# Patient Record
Sex: Male | Born: 1970 | Race: Black or African American | Hispanic: No | Marital: Single | Smoking: Former smoker
Health system: Southern US, Community
[De-identification: ages and names within clinical notes are randomized; demographics above are authoritative.]

## PROBLEM LIST (undated history)

## (undated) DIAGNOSIS — E119 Type 2 diabetes mellitus without complications: Secondary | ICD-10-CM

## (undated) DIAGNOSIS — I10 Essential (primary) hypertension: Secondary | ICD-10-CM

---

## 2004-07-11 ENCOUNTER — Emergency Department (HOSPITAL_COMMUNITY): Admission: EM | Admit: 2004-07-11 | Discharge: 2004-07-11 | Payer: Self-pay | Admitting: Emergency Medicine

## 2004-07-22 ENCOUNTER — Emergency Department (HOSPITAL_COMMUNITY): Admission: EM | Admit: 2004-07-22 | Discharge: 2004-07-22 | Payer: Self-pay | Admitting: Emergency Medicine

## 2004-07-25 ENCOUNTER — Emergency Department (HOSPITAL_COMMUNITY): Admission: EM | Admit: 2004-07-25 | Discharge: 2004-07-25 | Payer: Self-pay | Admitting: Emergency Medicine

## 2015-05-26 ENCOUNTER — Encounter (HOSPITAL_BASED_OUTPATIENT_CLINIC_OR_DEPARTMENT_OTHER): Payer: Self-pay

## 2015-05-26 ENCOUNTER — Emergency Department (HOSPITAL_BASED_OUTPATIENT_CLINIC_OR_DEPARTMENT_OTHER)
Admission: EM | Admit: 2015-05-26 | Discharge: 2015-05-26 | Disposition: A | Discharge status: Home / Self Care | Payer: Self-pay | Attending: Emergency Medicine | Admitting: Emergency Medicine

## 2015-05-26 ENCOUNTER — Emergency Department (HOSPITAL_BASED_OUTPATIENT_CLINIC_OR_DEPARTMENT_OTHER): Payer: Self-pay

## 2015-05-26 DIAGNOSIS — B9789 Other viral agents as the cause of diseases classified elsewhere: Secondary | ICD-10-CM

## 2015-05-26 DIAGNOSIS — E119 Type 2 diabetes mellitus without complications: Secondary | ICD-10-CM | POA: Insufficient documentation

## 2015-05-26 DIAGNOSIS — R0789 Other chest pain: Secondary | ICD-10-CM

## 2015-05-26 DIAGNOSIS — Z87891 Personal history of nicotine dependence: Secondary | ICD-10-CM | POA: Insufficient documentation

## 2015-05-26 DIAGNOSIS — J069 Acute upper respiratory infection, unspecified: Secondary | ICD-10-CM | POA: Insufficient documentation

## 2015-05-26 DIAGNOSIS — J9801 Acute bronchospasm: Secondary | ICD-10-CM | POA: Insufficient documentation

## 2015-05-26 DIAGNOSIS — I10 Essential (primary) hypertension: Secondary | ICD-10-CM | POA: Insufficient documentation

## 2015-05-26 HISTORY — DX: Essential (primary) hypertension: I10

## 2015-05-26 HISTORY — DX: Type 2 diabetes mellitus without complications: E11.9

## 2015-05-26 MED ORDER — AEROCHAMBER PLUS FLO-VU MEDIUM MISC
1.0000 | Freq: Once | Status: AC
  Administered 2015-05-26: 1
  Filled 2015-05-26: qty 1

## 2015-05-26 MED ORDER — BENZONATATE 100 MG PO CAPS: 200.0000 mg | ORAL_CAPSULE | Freq: Two times a day (BID) | ORAL | Status: DC | PRN

## 2015-05-26 MED ORDER — ALBUTEROL SULFATE (2.5 MG/3ML) 0.083% IN NEBU
5.0000 mg | INHALATION_SOLUTION | Freq: Once | RESPIRATORY_TRACT | Status: AC
  Administered 2015-05-26: 5 mg via RESPIRATORY_TRACT
  Filled 2015-05-26: qty 6

## 2015-05-26 MED ORDER — ALBUTEROL SULFATE HFA 108 (90 BASE) MCG/ACT IN AERS
2.0000 | INHALATION_SPRAY | RESPIRATORY_TRACT | Status: DC | PRN
  Administered 2015-05-26: 2 via RESPIRATORY_TRACT
  Filled 2015-05-26: qty 6.7

## 2015-05-26 MED ORDER — IBUPROFEN 800 MG PO TABS
800.0000 mg | ORAL_TABLET | Freq: Once | ORAL | Status: AC
  Administered 2015-05-26: 800 mg via ORAL
  Filled 2015-05-26: qty 1

## 2015-05-26 MED ORDER — IBUPROFEN 800 MG PO TABS: 800.0000 mg | ORAL_TABLET | Freq: Three times a day (TID) | ORAL | Status: AC

## 2015-05-26 MED ORDER — METHOCARBAMOL 500 MG PO TABS: 500.0000 mg | ORAL_TABLET | Freq: Two times a day (BID) | ORAL | Status: DC

## 2015-05-26 NOTE — ED Provider Notes (Signed)
CSN: [id number]     Arrival date & time 05/26/15  1615 History   First MD Initiated Contact with Patient 05/26/15 1726     Chief Complaint  Patient presents with  . URI     (Consider location/radiation/quality/duration/timing/severity/associated sxs/prior Treatment) Patient is a 44 y.o. male presenting with URI. The history is provided by the patient and medical records. No language interpreter was used.  URI Presenting symptoms: congestion, cough, fatigue, fever (subjective), rhinorrhea and sore throat   Presenting symptoms: no ear pain   Associated symptoms: wheezing   Associated symptoms: no arthralgias, no headaches and no myalgias     Ryan Gamble is a 44 y.o. male  with a hx of hypertension, non-insulin-dependent diabetes presents to the Emergency Department complaining of gradual, persistent, progressively worsening URI symptoms onset 4 days ago. Patient reports associated cough productive of clear sputum, sore throat, chills, nasal congestion, rhinorrhea, cough, anterior chest pain with coughing.  Patient denies known sick contacts. He reports taking Benadryl without relief. No other treatments prior to arrival. Patient reports that he is a former smoker but does not currently smoke. Nothing makes the symptoms better and coughing makes his chest pain worse.  Patient reports subjective fever at home but denies neck pain, neck stiffness, abdominal pain, nausea, vomiting, diarrhea, weakness, dizziness, syncope, dysuria.  Past Medical History  Diagnosis Date  . Hypertension   . Diabetes mellitus without complication (HCC)    History reviewed. No pertinent past surgical history. No family history on file. Social History  Substance Use Topics  . Smoking status: Former Games developer  . Smokeless tobacco: None  . Alcohol Use: No    Review of Systems  Constitutional: Positive for fever (subjective) and fatigue. Negative for chills and appetite change.  HENT: Positive for congestion,  postnasal drip, rhinorrhea, sinus pressure and sore throat. Negative for ear discharge, ear pain and mouth sores.   Eyes: Negative for visual disturbance.  Respiratory: Positive for cough, chest tightness, shortness of breath and wheezing. Negative for stridor.   Cardiovascular: Negative for chest pain, palpitations and leg swelling.  Gastrointestinal: Negative for nausea, vomiting, abdominal pain and diarrhea.  Genitourinary: Negative for dysuria, urgency, frequency and hematuria.  Musculoskeletal: Negative for myalgias, back pain, arthralgias and neck stiffness.  Skin: Negative for rash.  Neurological: Negative for syncope, light-headedness, numbness and headaches.  Hematological: Negative for adenopathy.  Psychiatric/Behavioral: The patient is not nervous/anxious.   All other systems reviewed and are negative.     Allergies  Shellfish allergy  Home Medications   Prior to Admission medications   Medication Sig Start Date End Date Taking? Authorizing Provider  benzonatate (TESSALON) 100 MG capsule Take 2 capsules (200 mg total) by mouth 2 (two) times daily as needed for cough. 05/26/15   Lukas Pelcher, PA-C  ibuprofen (ADVIL,MOTRIN) 800 MG tablet Take 1 tablet (800 mg total) by mouth 3 (three) times daily. 05/26/15   Wylee Dorantes, PA-C  methocarbamol (ROBAXIN) 500 MG tablet Take 1 tablet (500 mg total) by mouth 2 (two) times daily. 05/26/15   Latitia Housewright, PA-C   BP 124/79 mmHg  Pulse 98  Temp(Src) 98.9 F (37.2 C) (Oral)  Resp 18  Ht  (1.702 m)  Wt 131.543 kg  BMI 45.41 kg/m2  SpO2 97% Physical Exam  Constitutional: He is oriented to person, place, and time. He appears well-developed and well-nourished. No distress.  HENT:  Head: Normocephalic and atraumatic.  Right Ear: Tympanic membrane, external ear and ear canal normal.  Left  Ear: Tympanic membrane, external ear and ear canal normal.  Nose: Mucosal edema and rhinorrhea present. No epistaxis.  Right sinus exhibits no maxillary sinus tenderness and no frontal sinus tenderness. Left sinus exhibits no maxillary sinus tenderness and no frontal sinus tenderness.  Mouth/Throat: Uvula is midline and mucous membranes are normal. Mucous membranes are not pale and not cyanotic. No oropharyngeal exudate, posterior oropharyngeal edema, posterior oropharyngeal erythema or tonsillar abscesses.  Eyes: Conjunctivae are normal. Pupils are equal, round, and reactive to light.  Neck: Normal range of motion and full passive range of motion without pain.  Cardiovascular: Normal rate and intact distal pulses.   Pulmonary/Chest: Effort normal. No stridor. He has decreased breath sounds. He has wheezes.  Decreased breath sounds and fine expiratory wheezes throughout  Abdominal: Soft. Bowel sounds are normal. There is no tenderness.  Musculoskeletal: Normal range of motion.  Lymphadenopathy:    He has no cervical adenopathy.  Neurological: He is alert and oriented to person, place, and time.  Skin: Skin is warm and dry. No rash noted. He is not diaphoretic.  Psychiatric: He has a normal mood and affect.  Nursing note and vitals reviewed.   ED Course  Procedures (including critical care time) Labs Review Labs Reviewed - No data to display  Imaging Review Dg Chest 2 View  05/26/2015  CLINICAL DATA:  44 year old male with productive cough chills and sore throat EXAM: CHEST  2 VIEW COMPARISON:  Chest radiograph dated 07/25/2004 FINDINGS: The heart size and mediastinal contours are within normal limits. Both lungs are clear. The visualized skeletal structures are unremarkable. IMPRESSION: No active cardiopulmonary disease. Electronically Signed   By: Elgie CollardArash  Radparvar M.D.   On: 05/26/2015 18:41   I have personally reviewed and evaluated these images and lab results as part of my medical decision-making.   EKG Interpretation None      MDM   Final diagnoses:  Viral URI with cough  Chest wall pain   Bronchospasm   Ryan Gamble presents with URI symptoms.  Pt CXR negative for acute infiltrate. Patients symptoms are consistent with URI, likely viral etiology. Tidal volume improved significantly after albuterol. Wheezing resolved. Discussed that antibiotics are not indicated for viral infections. Pt will be discharged with symptomatic treatment.  Verbalizes understanding and is agreeable with plan. Pt is hemodynamically stable & in NAD prior to dc.   BP 124/79 mmHg  Pulse 98  Temp(Src) 98.9 F (37.2 C) (Oral)  Resp 18  Ht 5\' 7"  (1.702 m)  Wt 131.543 kg  BMI 45.41 kg/m2  SpO2 97%   Dierdre ForthHannah Deanna Boehlke, PA-C 05/26/15 1914  Geoffery Lyonsouglas Delo, MD 05/26/15 2336

## 2015-05-26 NOTE — ED Notes (Signed)
C/o prod cough, chills, sore throat, laryngitis since Saturday-NAD-steady gait

## 2015-05-26 NOTE — Discharge Instructions (Signed)
1. Medications: albuterol, OTC mucinex, tessalon, usual home medications 2. Treatment: rest, drink plenty of fluids, take tylenol or ibuprofen for fever control and muscle/joint aches 3. Follow Up: Please followup with your primary doctor in 3 days for discussion of your diagnoses and further evaluation after today's visit; if you do not have a primary care doctor use the resource guide provided to find one; Return to the ER for high fevers, difficulty breathing or other concerning symptoms   Upper Respiratory Infection, Adult Most upper respiratory infections (URIs) are a viral infection of the air passages leading to the lungs. A URI affects the nose, throat, and upper air passages. The most common type of URI is nasopharyngitis and is typically referred to as "the common cold." URIs run their course and usually go away on their own. Most of the time, a URI does not require medical attention, but sometimes a bacterial infection in the upper airways can follow a viral infection. This is called a secondary infection. Sinus and middle ear infections are common types of secondary upper respiratory infections. Bacterial pneumonia can also complicate a URI. A URI can worsen asthma and chronic obstructive pulmonary disease (COPD). Sometimes, these complications can require emergency medical care and may be life threatening.  CAUSES Almost all URIs are caused by viruses. A virus is a type of germ and can spread from one person to another.  RISKS FACTORS You may be at risk for a URI if:   You smoke.   You have chronic heart or lung disease.  You have a weakened defense (immune) system.   You are very young or very old.   You have nasal allergies or asthma.  You work in crowded or poorly ventilated areas.  You work in health care facilities or schools. SIGNS AND SYMPTOMS  Symptoms typically develop 2-3 days after you come in contact with a cold virus. Most viral URIs last 7-10 days. However,  viral URIs from the influenza virus (flu virus) can last 14-18 days and are typically more severe. Symptoms may include:   Runny or stuffy (congested) nose.   Sneezing.   Cough.   Sore throat.   Headache.   Fatigue.   Fever.   Loss of appetite.   Pain in your forehead, behind your eyes, and over your cheekbones (sinus pain).  Muscle aches.  DIAGNOSIS  Your health care provider may diagnose a URI by:  Physical exam.  Tests to check that your symptoms are not due to another condition such as:  Strep throat.  Sinusitis.  Pneumonia.  Asthma. TREATMENT  A URI goes away on its own with time. It cannot be cured with medicines, but medicines may be prescribed or recommended to relieve symptoms. Medicines may help:  Reduce your fever.  Reduce your cough.  Relieve nasal congestion. HOME CARE INSTRUCTIONS   Take medicines only as directed by your health care provider.   Gargle warm saltwater or take cough drops to comfort your throat as directed by your health care provider.  Use a warm mist humidifier or inhale steam from a shower to increase air moisture. This may make it easier to breathe.  Drink enough fluid to keep your urine clear or pale yellow.   Eat soups and other clear broths and maintain good nutrition.   Rest as needed.   Return to work when your temperature has returned to normal or as your health care provider advises. You may need to stay home longer to avoid infecting others.  You can also use a face mask and careful hand washing to prevent spread of the virus.  Increase the usage of your inhaler if you have asthma.   Do not use any tobacco products, including cigarettes, chewing tobacco, or electronic cigarettes. If you need help quitting, ask your health care provider. PREVENTION  The best way to protect yourself from getting a cold is to practice good hygiene.   Avoid oral or hand contact with people with cold symptoms.   Wash  your hands often if contact occurs.  There is no clear evidence that vitamin C, vitamin E, echinacea, or exercise reduces the chance of developing a cold. However, it is always recommended to get plenty of rest, exercise, and practice good nutrition.  SEEK MEDICAL CARE IF:   You are getting worse rather than better.   Your symptoms are not controlled by medicine.   You have chills.  You have worsening shortness of breath.  You have brown or red mucus.  You have yellow or brown nasal discharge.  You have pain in your face, especially when you bend forward.  You have a fever.  You have swollen neck glands.  You have pain while swallowing.  You have white areas in the back of your throat. SEEK IMMEDIATE MEDICAL CARE IF:   You have severe or persistent:  Headache.  Ear pain.  Sinus pain.  Chest pain.  You have chronic lung disease and any of the following:  Wheezing.  Prolonged cough.  Coughing up blood.  A change in your usual mucus.  You have a stiff neck.  You have changes in your:  Vision.  Hearing.  Thinking.  Mood. MAKE SURE YOU:   Understand these instructions.  Will watch your condition.  Will get help right away if you are not doing well or get worse.   This information is not intended to replace advice given to you by your health care provider. Make sure you discuss any questions you have with your health care provider.   Document Released: 12/06/2000 Document Revised: 10/27/2014 Document Reviewed: 09/17/2013 Elsevier Interactive Patient Education 2016 [employer]    Emergency Department Resource Guide 1) Find a Doctor and Pay Out of Pocket Although you won't have to find out who is covered by your insurance plan, it is a good idea to ask around and get recommendations. You will then need to call the office and see if the doctor you have chosen will accept you as a new patient and what types of options they offer for patients who  are self-pay. Some doctors offer discounts or will set up payment plans for their patients who do not have insurance, but you will need to ask so you aren't surprised when you get to your appointment.  2) Contact Your Local Health Department Not all health departments have doctors that can see patients for sick visits, but many do, so it is worth a call to see if yours does. If you don't know where your local health department is, you can check in your phone book. The CDC also has a tool to help you locate your state's health department, and many state websites also have listings of all of their local health departments.  3) Find a Walk-in Clinic If your illness is not likely to be very severe or complicated, you may want to try a walk in clinic. These are popping up all over the country in pharmacies, drugstores, and shopping centers. They're usually staffed by  nurse practitioners or physician assistants that have been trained to treat common illnesses and complaints. They're usually fairly quick and inexpensive. However, if you have serious medical issues or chronic medical problems, these are probably not your best option.  No Primary Care Doctor: - Call Health Connect at  [phone] - they can help you locate a primary care doctor that  accepts your insurance, provides certain services, etc. - Physician Referral Service- [phone]  Chronic Pain Problems: Organization         Address  Phone   Notes  [facility]  [phone] Patients need to be referred by their primary care doctor.   Medication Assistance: Organization         Address  Phone   Notes  [facility] Medication [facility] [address] [place] [place], [place] [address] [phone] --Must be a resident of [facility] -- Must have NO insurance coverage whatsoever (no Medicaid/ Medicare, etc.) -- The pt. MUST have a primary care doctor that directs their care regularly and follows  them in the community   [facility]  [phone]   [employer]  [phone]    Agencies that provide inexpensive medical care: Organization         Address  Phone   Notes  [facility]  [phone]   [facility]    [phone]   [facility] [address] [place], [place] [address] [phone]   [facility] [address] [place], [place] [phone]   [facility]anned Parenthood    [phone]   [facility]    [phone]   [facility]  [address], [place] Phone:  [phone], Fax:  [phone] Hours of Operation:  9 am - 6 pm, M-F.  Also accepts Medicaid/Medicare and self-pay.  [facility]  [address], [place] Phone: [phone], Fax: [phone]. Hours of Operation:  8:30 am - 5:30 pm, M-F.  Also accepts Medicaid and self-pay.  [facility] [place] [address], [place] Phone: [phone]   [facility] [address] Natasha Bence [place], [place] [phone], Ext. [phone] Mondays & Thursdays: 7-9 AM.  First 15 patients are seen on a first come, first serve basis.    Medicaid-accepting [facility] Providers:  Organization         Address  Phone   Notes  [facility] [address], [place] [phone] Also accepts self-pay patients.  [facility] [address] Laurell Josephs [place], [place]  [phone]   [facility] [address], [place] [phone]   [facility] [address] [place] [phone]   Renaye Rakers [address], [place]   [phone] Only accepts [place] Access [place] patients after they have their name applied to their card.   Self-Pay (no insurance) in [facility]:  Organization         Address  Phone   Notes  Sickle Cell  Patients, [facility] [address] [place], [place] [phone]   [facility] [address] [place] [employer], [place] [phone]   [facility] [place]  [address], [place] [phone]   [facility]/Dr. Julio Sicks  [address], [place] or [address], [place] [phone] Phone number for both [place] and [place] locations is the same.  [facility] [address] [place] [phone]   [facility] [address] [place] or [address] [phone]   [facility] [address], [place] [phone], phone; [phone], fax Sees patients 1st and 3rd Saturday of every month.  Must not qualify for public or private insurance (i.e. Medicaid, Medicare, [place] Health Choice, Veterans' Benefits)  Household income should be no more than 200% of the poverty level The clinic cannot treat you if you are pregnant or think you are pregnant  Sexually transmitted diseases are not treated at the clinic.    Dental Care: Organization         Address  Phone  Notes  [facility] Department of [facility] [address] [place], [place] [phone] Accepts children up to age 68 who are enrolled in [place] or [place] Health Choice; pregnant women with a Medicaid card; and children who have applied for Medicaid or [place] Health Choice, but were declined, whose parents can pay a reduced fee at time of service.  [facility] Department of [facility]  [address], [place] [phone] Accepts children up to age 57 who are enrolled in [place] or [place] Health Choice; pregnant women with a Medicaid card; and children who have applied for Medicaid or [place] Health Choice, but were declined, whose parents can pay a reduced fee at time of service.  [facility]  [address] [place], [place] [phone] Patients are seen by appointment only. Walk-ins are not accepted. [facility] will see patients 75 years of age and older. Monday - Tuesday (8am-5pm) Most Wednesdays (8:30-5pm) $30 per visit, cash only  [facility]  [address], [facility] [phone] Patients are seen by appointment only. Walk-ins are not accepted. [facility] will see patients 46 years of age and older. One Wednesday Evening (Monthly: Volunteer Based).  $30 per visit, cash only  [employer]  [phone] for adults; Children under age 18, call Graduate Pediatric Dentistry at [phone]. Children aged 12-14, please call [phone] to request a pediatric application.  Dental services are provided in all areas of dental care including fillings, crowns and bridges, complete and partial dentures, implants, gum treatment, root canals, and extractions. Preventive care is also provided. Treatment is provided to both adults and children. Patients are selected via a lottery and there is often a waiting list.   [facility] [address] [place]  [phone] [email or web address]   [facility] [address] [place], [place] [phone], Ext. [phone] Second and Fourth Thursday of each month, opens at 6:30 AM; Clinic ends at 9 AM.  Patients are seen on a first-come first-served basis, and a limited number are seen during each clinic.   [facility]  [address] Ether Griffins [place], [place] [phone]   Eligibility Requirements You must have lived in [place], [place], or [place] counties for at least the last three months.   You cannot be eligible for state or federal sponsored [place], including [employer], [place], or [employer].   You generally cannot be eligible for healthcare insurance through your employer.  How to apply: Eligibility  screenings are held every Tuesday and Wednesday afternoon from 1:00 pm until 4:00 pm. You do not need an appointment for the interview!  [facility] [address] [place], [place] [phone]   [facility] Health Department  [phone]   [facility]  [phone]   [facility] Health Department  [phone]    Behavioral Health Resources in the Community: Intensive Outpatient Programs Organization         Address  Phone  Notes  [facility] [address] [place], [place] [phone]   [facility] Outpatient [address] [place], [place] [phone]   [facility] [address] [place], [place]  [phone]   [facility] [address],  [place], [place] [phone] or [phone]   Substance Abuse Resources Organization         Address  Phone  Notes  [facility]  [phone]   [facility]  [phone]   [facility]  [phone]   Floydene Flock  [phone]   Residential & Outpatient Substance Abuse Program  [phone]   Psychological Services Organization         Address  Phone  Notes  [facility]  [phone]   [facility]  [phone]   [facility] Health [address] [place] [phone] or [phone]    Mobile Crisis Teams Organization         Address  Phone  Notes  [facility], Mobile Crisis Care Unit  [phone]   [facility]  [address] [place], [place] [phone]   Doristine Locks [address] [place] [phone]    Self-Help/Support Groups Organization         Address  Phone             Notes  [facility] [place] - variety of support groups  336- I7437963 Call for more information  Narcotics Anonymous (NA), [facility] [address], [employer] [place]  2 meetings at  this location   Statistician         Address  Phone  Notes  [facility] [address],    [place]  [phone]   [facility]  [address], [place] [address], [place], [place] [phone]   [facility] [address] [place], [place] [phone] Admissions: 8am-3pm M-F  [facility] [address] [place] [address]    [place], [place] [phone]   [facility] [address] [place], [place], [place] [phone]   [facility] [address]  [place], [place] [phone]   [facility] - Intensive Outpatient [address] Laurell Joseph[address], [place], [place] [phone]   [facility] ([facility] [address] [place]  [place], [place] [phone] or [phone]   [facility] ([facility]) [address], [place], [place] [phone] Accepts Medicaid  [facility] [address]  [place] [phone] Substance Abuse/Addiction Treatment   [facility] Organization         Address  Phone  Notes  [facility]  [phone]   Angie Fava, PhD [address] Ervin Knack [place], [place]   [phone] or [phone]   [facility]   [address] [place], [place] [phone]   [facility] [address] [place], [place] [phone] Insurance/Medicaid/sponsorship through [employer] and Families [address]                                    [place], [place] [phone] Therapy/tele-psych/case  [facility] [address]   [place], [place] [phone]    Dr. Lolly Mustache  [phone]   [facility]  [employer] [facility] Dept. 1) [address], [place] 2) [address] [place] 3)  [address], [place] [phone]  [phone]   [facility] Child Abuse Hotline (419)810-7622 or [phone] (After Hours)

## 2016-06-06 ENCOUNTER — Encounter (HOSPITAL_BASED_OUTPATIENT_CLINIC_OR_DEPARTMENT_OTHER): Payer: Self-pay | Admitting: Emergency Medicine

## 2016-06-06 ENCOUNTER — Emergency Department (HOSPITAL_BASED_OUTPATIENT_CLINIC_OR_DEPARTMENT_OTHER)
Admission: EM | Admit: 2016-06-06 | Discharge: 2016-06-06 | Disposition: A | Discharge status: Home / Self Care | Payer: BLUE CROSS/BLUE SHIELD | Attending: Emergency Medicine | Admitting: Emergency Medicine

## 2016-06-06 DIAGNOSIS — I10 Essential (primary) hypertension: Secondary | ICD-10-CM | POA: Insufficient documentation

## 2016-06-06 DIAGNOSIS — M5416 Radiculopathy, lumbar region: Secondary | ICD-10-CM | POA: Insufficient documentation

## 2016-06-06 DIAGNOSIS — E1165 Type 2 diabetes mellitus with hyperglycemia: Secondary | ICD-10-CM | POA: Diagnosis not present

## 2016-06-06 DIAGNOSIS — M5441 Lumbago with sciatica, right side: Secondary | ICD-10-CM | POA: Diagnosis not present

## 2016-06-06 DIAGNOSIS — M545 Low back pain: Secondary | ICD-10-CM | POA: Diagnosis present

## 2016-06-06 DIAGNOSIS — Z9114 Patient's other noncompliance with medication regimen: Secondary | ICD-10-CM | POA: Insufficient documentation

## 2016-06-06 DIAGNOSIS — Z79899 Other long term (current) drug therapy: Secondary | ICD-10-CM | POA: Diagnosis not present

## 2016-06-06 DIAGNOSIS — Z87891 Personal history of nicotine dependence: Secondary | ICD-10-CM | POA: Insufficient documentation

## 2016-06-06 LAB — BASIC METABOLIC PANEL
ANION GAP: 7 (ref 5–15)
BUN: 9 mg/dL (ref 6–20)
CHLORIDE: 105 mmol/L (ref 101–111)
CO2: 25 mmol/L (ref 22–32)
Calcium: 9.7 mg/dL (ref 8.9–10.3)
Creatinine, Ser: 0.84 mg/dL (ref 0.61–1.24)
GFR calc non Af Amer: >60 mL/min (ref >60)
Glucose, Bld: 298 mg/dL — ABNORMAL HIGH (ref 65–99)
POTASSIUM: 3.8 mmol/L (ref 3.5–5.1)
Sodium: 137 mmol/L (ref 135–145)

## 2016-06-06 MED ORDER — METHOCARBAMOL 500 MG PO TABS
500.0000 mg | ORAL_TABLET | Freq: Two times a day (BID) | ORAL | 0 refills | Status: DC | PRN
Start: 2016-06-06 — End: 2017-12-18

## 2016-06-06 MED ORDER — METFORMIN HCL 500 MG PO TABS: 500.0000 mg | ORAL_TABLET | Freq: Two times a day (BID) | ORAL | 0 refills | Status: DC

## 2016-06-06 MED ORDER — ACETAMINOPHEN 500 MG PO TABS
1000.0000 mg | ORAL_TABLET | Freq: Once | ORAL | Status: AC
  Administered 2016-06-06: 1000 mg via ORAL
  Filled 2016-06-06: qty 2

## 2016-06-06 NOTE — ED Provider Notes (Signed)
[facility] Provider Note   CSN: [id number] Arrival date & time: 06/06/16  1319     History   Chief Complaint Chief Complaint  Patient presents with  . Back Pain    HPI Ryan Gamble is a 45 y.o. male.Complains of low back pain onset last night. Pain radiates to right calf last night however does not radiate now. Pain is worse with changing position and improved with remaining still. He treated himself with Aleve, without relief. No fever no loss of bladder or bowel control. No trauma. No other associated symptoms. He's been having similar pain intermittently since the beginning of November 2017.  HPI  Past Medical History:  Diagnosis Date  . Diabetes mellitus without complication (HCC)   . Hypertension    Type 2 diabetes since age 45 There are no active problems to display for this patient.   History reviewed. No pertinent surgical history.     Home Medications    Prior to Admission medications   Medication Sig Start Date End Date Taking? Authorizing Provider  naproxen sodium (ANAPROX) 220 MG tablet Take 220 mg by mouth 2 (two) times daily with a meal.   Yes Historical Provider, MD  acetaminophen (TYLENOL) 500 MG chewable tablet Chew 500 mg by mouth every 6 (six) hours as needed for pain.    Historical Provider, MD  benzonatate (TESSALON) 100 MG capsule Take 2 capsules (200 mg total) by mouth 2 (two) times daily as needed for cough. 05/26/15   Hannah Muthersbaugh, PA-C  ibuprofen (ADVIL,MOTRIN) 800 MG tablet Take 1 tablet (800 mg total) by mouth 3 (three) times daily. 05/26/15   Hannah Muthersbaugh, PA-C  methocarbamol (ROBAXIN) 500 MG tablet Take 1 tablet (500 mg total) by mouth 2 (two) times daily. 05/26/15   Dahlia ClientHannah Muthersbaugh, PA-C    Family History History reviewed. No pertinent family history.  Social History Social History  Substance Use Topics  . Smoking status: Former Games developermoker  . Smokeless tobacco: Never Used  . Alcohol use No   Occasional  alcohol no drug use  Allergies   Shellfish allergy   Review of Systems Review of Systems  Constitutional: Negative.   HENT: Negative.   Respiratory: Negative.   Cardiovascular: Negative.   Gastrointestinal: Negative.   Musculoskeletal: Positive for back pain.  Skin: Negative.   Allergic/Immunologic: Positive for immunocompromised state.       Diabetic  Neurological: Negative.   Psychiatric/Behavioral: Negative.   All other systems reviewed and are negative.    Physical Exam Updated Vital Signs BP 158/85 (BP Location: Left Arm)   Pulse 77   Temp 98 F (36.7 C) (Oral)   Resp 18   Ht 5\' 7"  (1.702 m)   Wt 283 lb (128.4 kg)   SpO2 99%   BMI 44.32 kg/m   Physical Exam  Constitutional: He is oriented to person, place, and time. He appears well-developed and well-nourished.  HENT:  Head: Normocephalic and atraumatic.  Eyes: Conjunctivae are normal. Pupils are equal, round, and reactive to light.  Neck: Neck supple. No tracheal deviation present. No thyromegaly present.  Cardiovascular: Normal rate and regular rhythm.   No murmur heard. Pulmonary/Chest: Effort normal and breath sounds normal.  Abdominal: Soft. Bowel sounds are normal. He exhibits no distension. There is no tenderness.  Morbidly obese  Musculoskeletal: Normal range of motion. He exhibits no edema or tenderness.  Entire spine is nontender. He has pain at lumbar spine when flexing ways.  Neurological: He is alert and oriented to  person, place, and time. No cranial nerve deficit. Coordination normal.  Gait normal. DTRs symmetric bilaterally at knee jerk ankle jerk and biceps toes to order bilaterally  Skin: Skin is warm and dry. No rash noted.  Psychiatric: He has a normal mood and affect.  Nursing note and vitals reviewed.   6:45 PM Reports no relief after Tylenol but feels ready to go home. ED Treatments / Results  Labs (all labs ordered are listed, but only abnormal results are displayed) Labs  Reviewed - No data to display  EKG  EKG Interpretation None       Radiology No results found.  Procedures Procedures (including critical care time)  Medications Ordered in ED Medications - No data to display  Results for orders placed or performed during the hospital encounter of 06/06/16  Basic metabolic panel  Result Value Ref Range   Sodium 137 135 - 145 mmol/L   Potassium 3.8 3.5 - 5.1 mmol/L   Chloride 105 101 - 111 mmol/L   CO2 25 22 - 32 mmol/L   Glucose, Bld 298 (H) 65 - 99 mg/dL   BUN 9 6 - 20 mg/dL   Creatinine, Ser 1.610.84 0.61 - 1.24 mg/dL   Calcium 9.7 8.9 - 09.610.3 mg/dL   GFR calc non Af Amer >60 >60 mL/min   GFR calc Af Amer >60 >60 mL/min   Anion gap 7 5 - 15   No results found. Initial Impression / Assessment and Plan / ED Course  I have reviewed the triage vital signs and the nursing notes.  Pertinent labs & imaging results that were available during my care of the patient were reviewed by me and considered in my medical decision making (see chart for details). Patient has acute on chronic low back pain with right-sided sciatica. He's been noncompliant with metformin which she's had in the past and has no primary care physician. Plan prescriptions Robaxin, metformin which he did not fill a previous visit, Tylenol for pain.Marland Kitchen. Referral [facility]. I'll feel the patient needs emergent imaging as she's had these symptoms in the past. No fever. Nontoxic-appearing Clinical Course       Final Clinical Impressions(s) / ED Diagnoses  Diagnosis #1 lumbar radiculopathy #2 hyperglycemia 3 medication noncompliance Final diagnoses:  None    New Prescriptions New Prescriptions   No medications on file     Doug SouSam Sanjay Broadfoot, MD 06/06/16 [phone]

## 2016-06-06 NOTE — Discharge Instructions (Signed)
Take Tylenol for pain and the muscle relaxer prescribed as needed for spasm in your back or for pain. It may cause drowsiness, so don't take it when you need to work or drive. Take the medication metformin for your diabetes. [facility] or any of the numbers on your discharge instructions to get a primary care physician to get rechecked within the next 2 or 3 weeks. Return if concern for any reason

## 2016-06-06 NOTE — ED Triage Notes (Signed)
Patient c/o right lumbar back pain for >1 month, with pain radiating into his thigh. Patient reports a hx of a "bulging or herniated disc" and thought it was the same. Patient has been taking Aleve and using Icy hot and stretching. The patient reports the pain resolved, until last night when he was unable to stand from a sitting position without assistance. Patient states he applied another icy hot patch with some relief, but throughout the night the pain worsened.  Patient denies surgery with his back problem in the past.

## 2016-06-06 NOTE — ED Notes (Signed)
ED Provider at bedside. 

## 2016-06-20 ENCOUNTER — Emergency Department (HOSPITAL_BASED_OUTPATIENT_CLINIC_OR_DEPARTMENT_OTHER)
Admission: EM | Admit: 2016-06-20 | Discharge: 2016-06-20 | Disposition: A | Discharge status: Home / Self Care | Payer: BLUE CROSS/BLUE SHIELD | Attending: Emergency Medicine | Admitting: Emergency Medicine

## 2016-06-20 ENCOUNTER — Encounter (HOSPITAL_BASED_OUTPATIENT_CLINIC_OR_DEPARTMENT_OTHER): Payer: Self-pay

## 2016-06-20 DIAGNOSIS — Z87891 Personal history of nicotine dependence: Secondary | ICD-10-CM | POA: Diagnosis not present

## 2016-06-20 DIAGNOSIS — E119 Type 2 diabetes mellitus without complications: Secondary | ICD-10-CM | POA: Insufficient documentation

## 2016-06-20 DIAGNOSIS — M5441 Lumbago with sciatica, right side: Secondary | ICD-10-CM | POA: Diagnosis not present

## 2016-06-20 DIAGNOSIS — Z79899 Other long term (current) drug therapy: Secondary | ICD-10-CM | POA: Diagnosis not present

## 2016-06-20 DIAGNOSIS — I10 Essential (primary) hypertension: Secondary | ICD-10-CM | POA: Diagnosis not present

## 2016-06-20 DIAGNOSIS — Z7984 Long term (current) use of oral hypoglycemic drugs: Secondary | ICD-10-CM | POA: Diagnosis not present

## 2016-06-20 DIAGNOSIS — M545 Low back pain: Secondary | ICD-10-CM | POA: Diagnosis present

## 2016-06-20 MED ORDER — BUPIVACAINE HCL (PF) 0.5 % IJ SOLN
10.0000 mL | Freq: Once | INTRAMUSCULAR | Status: AC
  Administered 2016-06-20: 10 mL
  Filled 2016-06-20: qty 10

## 2016-06-20 MED ORDER — NAPROXEN 500 MG PO TABS: 500.0000 mg | ORAL_TABLET | Freq: Two times a day (BID) | ORAL | 0 refills | Status: AC

## 2016-06-20 NOTE — ED Triage Notes (Signed)
C/o lower back pain that goes down right leg-started after lifting ornaments-seen here for same-NAD-presents to triage in w/c

## 2016-06-20 NOTE — ED Notes (Signed)
ED Provider at bedside. 

## 2016-06-20 NOTE — ED Notes (Signed)
ED Provider at bedside injecting pts back.

## 2016-06-20 NOTE — ED Provider Notes (Signed)
[facility]   CSN: [id number] Arrival date & time: 06/20/16  2046  By signing my name below, I, Rosario AdieWilliam Andrew Hiatt, attest that this documentation has been prepared under the direction and in the presence of Lyndal Pulleyaniel Kewan Mcnease, MD. Electronically Signed: Rosario AdieWilliam Andrew Hiatt, ED Scribe. 06/20/16. 9:41 PM.  History   Chief Complaint Chief Complaint  Patient presents with  . Back Pain   The history is provided by the patient and medical records. No language interpreter was used.  Back Pain   This is a chronic problem. The problem occurs constantly. The problem has been gradually worsening. The pain is associated with no known injury. The pain is present in the lumbar spine. Quality: spasmodic. The pain is at a severity of 10/10. The symptoms are aggravated by twisting. The pain is the same all the time. Pertinent negatives include no fever, no bowel incontinence and no bladder incontinence. He has tried muscle relaxants and analgesics for the symptoms. The treatment provided no relief.    HPI Comments: Ryan BogaJoseph Gamble is a 45 y.o. male with a PMHx of DM and HTN, who presents to the Emergency Department complaining of intermittent, acute on chronic right-sided, lower back pain onset several weeks ago, worsening since last night. Pt notes radiation of his pain down into his posterior right thigh and calf, and he additionally states that his pain is spasmodic in nature. No recent trauma or heavy lifting. Pt was seen in the ED for same on 06/06/16, and at that time he was prescribed Robaxin. He has been taking this at home with moderate relief, but notes that his pain has now since worsened again and this medication is no longer assisting his pain. He has also been taking Aleve and Tylenol at home without relief of his pain in this current episode. His pain is mildly alleviated with standing and exacerbated with twisting movements of positional changes. He denies bowel/bladder  incontinence, fever, or any other associated symptoms.   Past Medical History:  Diagnosis Date  . Diabetes mellitus without complication (HCC)   . Hypertension    There are no active problems to display for this patient.  History reviewed. No pertinent surgical history.  Home Medications    Prior to Admission medications   Medication Sig Start Date End Date Taking? Authorizing Provider  acetaminophen (TYLENOL) 500 MG chewable tablet Chew 500 mg by mouth every 6 (six) hours as needed for pain.    Historical Provider, MD  ibuprofen (ADVIL,MOTRIN) 800 MG tablet Take 1 tablet (800 mg total) by mouth 3 (three) times daily. 05/26/15   Hannah Muthersbaugh, PA-C  metFORMIN (GLUCOPHAGE) 500 MG tablet Take 1 tablet (500 mg total) by mouth 2 (two) times daily with a meal. 06/06/16   Doug SouSam Jacubowitz, MD  methocarbamol (ROBAXIN) 500 MG tablet Take 1 tablet (500 mg total) by mouth 2 (two) times daily as needed for muscle spasms. 06/06/16   Doug SouSam Jacubowitz, MD  naproxen sodium (ANAPROX) 220 MG tablet Take 220 mg by mouth 2 (two) times daily with a meal.    Historical Provider, MD   Family History No family history on file.  Social History Social History  Substance Use Topics  . Smoking status: Former Games developermoker  . Smokeless tobacco: Never Used  . Alcohol use No   Allergies   Shellfish allergy  Review of Systems Review of Systems  Constitutional: Negative for fever.  Gastrointestinal: Negative for bowel incontinence.  Genitourinary: Negative for bladder incontinence.  Musculoskeletal: Positive  for back pain and myalgias.  Neurological:       Negative for bowel/bladder incontinence.  All other systems reviewed and are negative.  Physical Exam Updated Vital Signs BP 140/90 (BP Location: Right Arm)   Pulse 87   Temp 98 F (36.7 C) (Oral)   Resp 20   Ht 5\' 7"  (1.702 m)   Wt 276 lb (125.2 kg)   SpO2 98%   BMI 43.23 kg/m   Physical Exam  Constitutional: He is oriented to person, place,  and time. He appears well-developed and well-nourished. No distress.  HENT:  Head: Normocephalic and atraumatic.  Nose: Nose normal.  Eyes: Conjunctivae are normal.  Neck: Neck supple. No tracheal deviation present.  Cardiovascular: Normal rate and regular rhythm.   Pulmonary/Chest: Effort normal. No respiratory distress.  Abdominal: Soft. He exhibits no distension.  Musculoskeletal: He exhibits tenderness.  Right superior gluteal tenderness is noted.   Neurological: He is alert and oriented to person, place, and time.  Skin: Skin is warm and dry.  Psychiatric: He has a normal mood and affect.   ED Treatments / Results  DIAGNOSTIC STUDIES: Oxygen Saturation is 98% on RA, normal by my interpretation.   COORDINATION OF CARE: 9:47 PM-Discussed next steps with pt. Pt verbalized understanding and is agreeable with the plan.   Labs (all labs ordered are listed, but only abnormal results are displayed) Labs Reviewed - No data to display  EKG  EKG Interpretation None      Radiology No results found.  Procedures Procedures   Procedure Note: Trigger Point Injection for Myofascial pain  Performed by Dr. Clydene PughKnott Indication: muscle/myofascial pain Muscle body and tendon sheath of the right superior gluteus muscle(s) were injected with 0.5% bupivacaine under sterile technique for release of muscle spasm/pain. Patient tolerated well with immediate improvement of symptoms and no immediate complications following procedure.  CPT Code:   1 or 2 muscle bodies: 20552  Medications Ordered in ED Medications  bupivacaine (MARCAINE) 0.5 % injection 10 mL (10 mLs Infiltration Given 06/20/16 2150)    Initial Impression / Assessment and Plan / ED Course  I have reviewed the triage vital signs and the nursing notes.  Pertinent labs & imaging results that were available during my care of the patient were reviewed by me and considered in my medical decision making (see chart for  details).  Clinical Course    45 y.o. male presents with back pain in lumbar area since the beginning of the month with signs of radicular pain. No acute traumatic onset. No red flag symptoms of fever, weight loss, saddle anesthesia, weakness, fecal/urinary incontinence or urinary retention. Offered local trigger point injection for symptomatic treatment. Suspect MSK etiology. No indication for imaging emergently. Patient was recommended to take short course of scheduled NSAIDs and engage in early mobility as definitive treatment. Return precautions discussed for worsening or new concerning symptoms.    Final Clinical Impressions(s) / ED Diagnoses   Final diagnoses:  Acute right-sided low back pain with right-sided sciatica   New Prescriptions New Prescriptions   No medications on file   I personally performed the services described in this documentation, which was scribed in my presence. The recorded information has been reviewed and is accurate.      Lyndal Pulleyaniel Daelon Dunivan, MD 06/21/16 [phone]

## 2017-03-22 IMAGING — CR DG CHEST 2V
2 series · 2 of 2 positions shown · non-contrast
Comparison: Chest radiograph dated 07/25/2004

CLINICAL DATA: 44-year-old male with productive cough chills and
sore throat

EXAM:
CHEST  2 VIEW

[w chest pa]
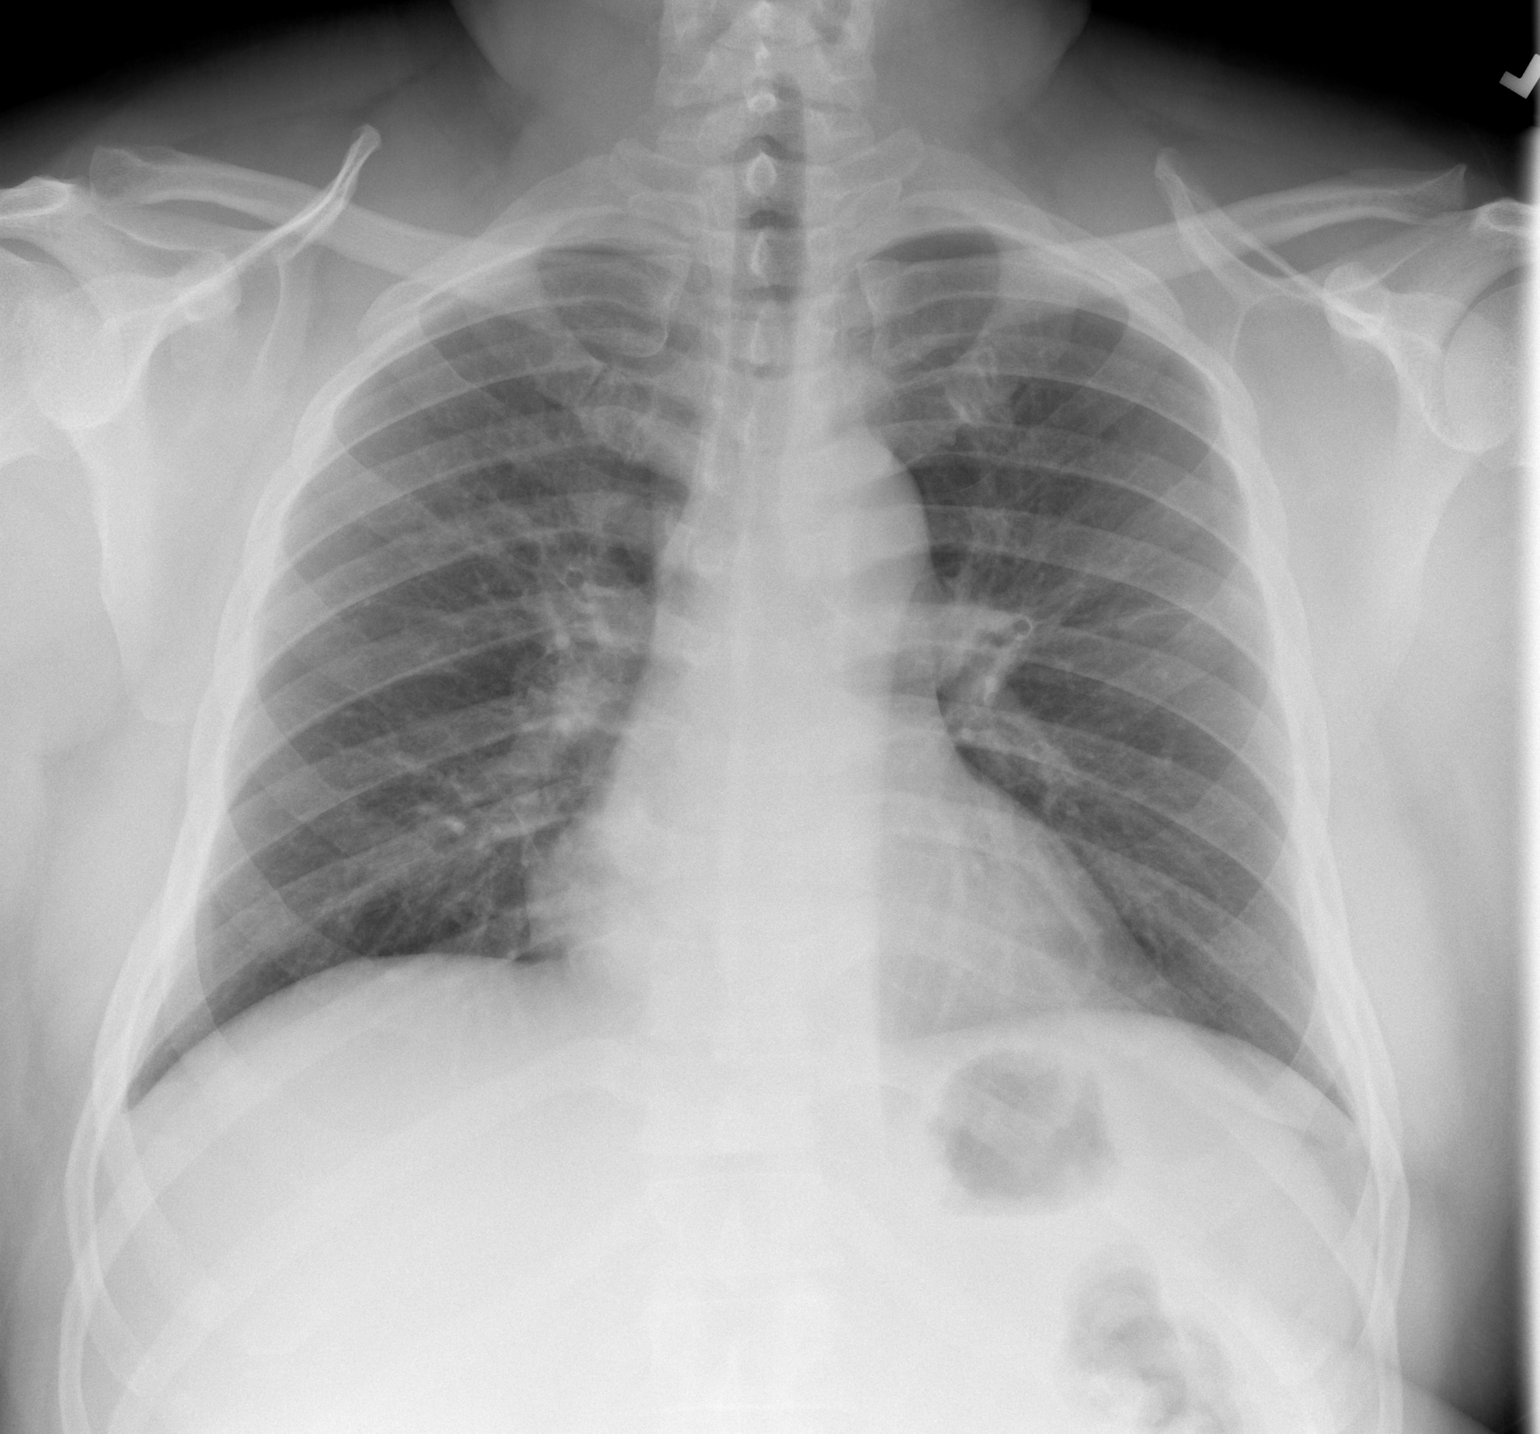

[w chest lat]
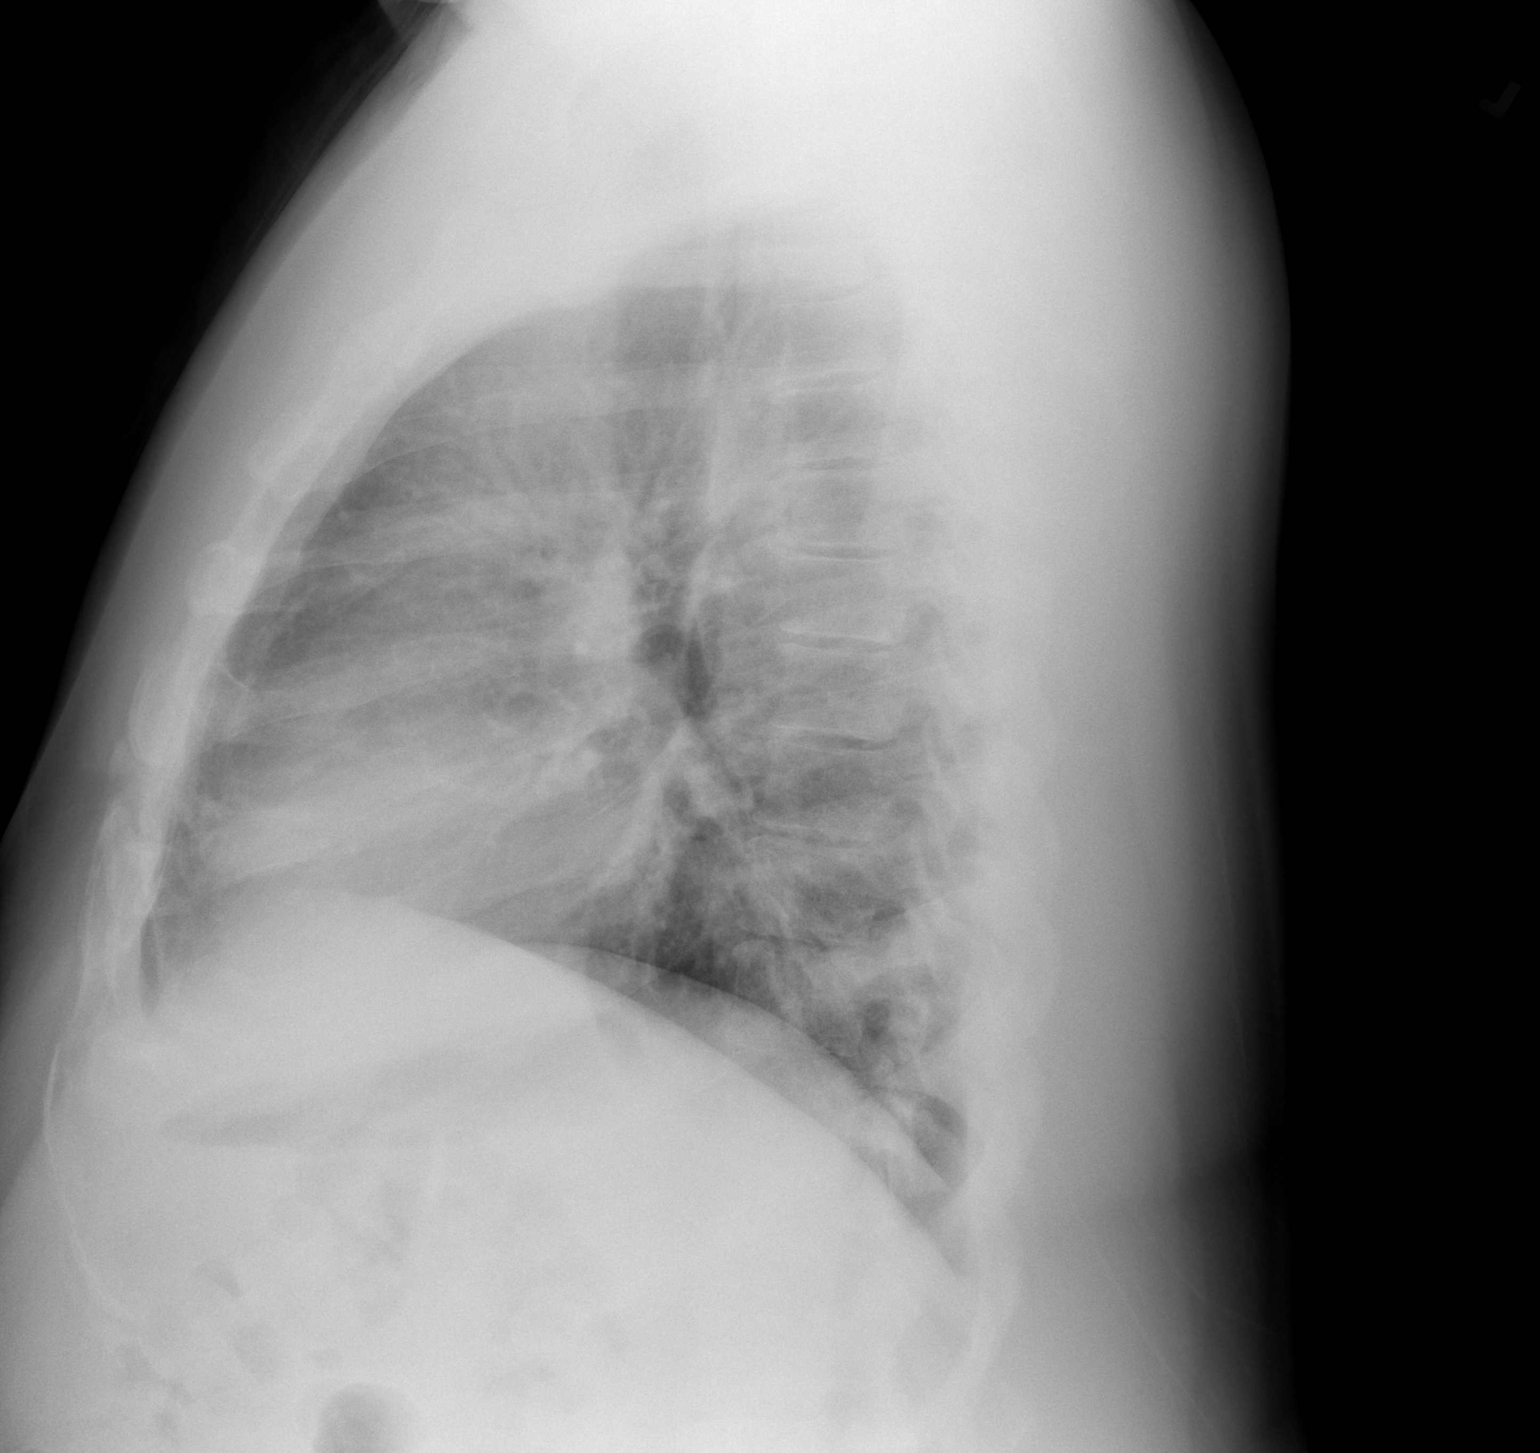

[2 of 2 positions shown; findings below may reference images not displayed]

FINDINGS: The heart size and mediastinal contours are within normal limits.
Both lungs are clear. The visualized skeletal structures are
unremarkable.
IMPRESSION: No active cardiopulmonary disease.

## 2017-12-18 ENCOUNTER — Emergency Department (HOSPITAL_COMMUNITY)
Admission: EM | Admit: 2017-12-18 | Discharge: 2017-12-19 | Disposition: A | Discharge status: Home / Self Care | Payer: Medicaid Other | Attending: Emergency Medicine | Admitting: Emergency Medicine

## 2017-12-18 ENCOUNTER — Encounter (HOSPITAL_COMMUNITY): Payer: Self-pay | Admitting: Emergency Medicine

## 2017-12-18 DIAGNOSIS — M5431 Sciatica, right side: Secondary | ICD-10-CM | POA: Insufficient documentation

## 2017-12-18 DIAGNOSIS — E119 Type 2 diabetes mellitus without complications: Secondary | ICD-10-CM | POA: Insufficient documentation

## 2017-12-18 DIAGNOSIS — Z79899 Other long term (current) drug therapy: Secondary | ICD-10-CM | POA: Insufficient documentation

## 2017-12-18 DIAGNOSIS — Z87891 Personal history of nicotine dependence: Secondary | ICD-10-CM | POA: Insufficient documentation

## 2017-12-18 DIAGNOSIS — I10 Essential (primary) hypertension: Secondary | ICD-10-CM | POA: Insufficient documentation

## 2017-12-18 MED ORDER — LORAZEPAM 2 MG/ML IJ SOLN
0.5000 mg | Freq: Once | INTRAMUSCULAR | Status: AC
  Administered 2017-12-18: 0.5 mg via INTRAVENOUS
  Filled 2017-12-18: qty 1

## 2017-12-18 MED ORDER — SODIUM CHLORIDE 0.9 % IV BOLUS
500.0000 mL | Freq: Once | INTRAVENOUS | Status: AC
  Administered 2017-12-18: 500 mL via INTRAVENOUS

## 2017-12-18 MED ORDER — KETOROLAC TROMETHAMINE 15 MG/ML IJ SOLN
15.0000 mg | Freq: Once | INTRAMUSCULAR | Status: AC
Start: 2017-12-18 — End: 2017-12-18
  Administered 2017-12-18: 15 mg via INTRAMUSCULAR
  Filled 2017-12-18: qty 1

## 2017-12-18 MED ORDER — DIAZEPAM 5 MG PO TABS
10.0000 mg | ORAL_TABLET | Freq: Once | ORAL | Status: AC
  Administered 2017-12-18: 10 mg via ORAL
  Filled 2017-12-18: qty 2

## 2017-12-18 MED ORDER — METHOCARBAMOL 1000 MG/10ML IJ SOLN: 1000.0000 mg | Freq: Once | INTRAMUSCULAR | Status: DC

## 2017-12-18 MED ORDER — PREDNISONE 10 MG (21) PO TBPK: ORAL_TABLET | Freq: Every day | ORAL | 0 refills | Status: AC

## 2017-12-18 MED ORDER — HYDROMORPHONE HCL 1 MG/ML IJ SOLN
1.0000 mg | Freq: Once | INTRAMUSCULAR | Status: AC
  Administered 2017-12-18: 1 mg via INTRAMUSCULAR
  Filled 2017-12-18: qty 1

## 2017-12-18 MED ORDER — METHOCARBAMOL 1000 MG/10ML IJ SOLN
1000.0000 mg | Freq: Once | INTRAVENOUS | Status: AC
  Administered 2017-12-18: 1000 mg via INTRAVENOUS
  Filled 2017-12-18: qty 10

## 2017-12-18 MED ORDER — METHOCARBAMOL 500 MG PO TABS: 500.0000 mg | ORAL_TABLET | Freq: Two times a day (BID) | ORAL | 0 refills | Status: AC

## 2017-12-18 MED ORDER — MORPHINE SULFATE (PF) 4 MG/ML IV SOLN
4.0000 mg | Freq: Once | INTRAVENOUS | Status: AC
  Administered 2017-12-18: 4 mg via INTRAVENOUS
  Filled 2017-12-18: qty 1

## 2017-12-18 MED ORDER — FENTANYL CITRATE (PF) 100 MCG/2ML IJ SOLN
50.0000 ug | Freq: Once | INTRAMUSCULAR | Status: AC
  Administered 2017-12-18: 50 ug via INTRAVENOUS
  Filled 2017-12-18: qty 2

## 2017-12-18 MED ORDER — KETOROLAC TROMETHAMINE 15 MG/ML IJ SOLN: 15.0000 mg | Freq: Once | INTRAMUSCULAR | Status: DC

## 2017-12-18 NOTE — ED Provider Notes (Addendum)
[facility] EMERGENCY DEPARTMENT Provider Note   CSN: [id number] Arrival date & time: 12/18/17  1421     History   Chief Complaint Chief Complaint  Patient presents with  . Back Pain    HPI Ryan Gamble is a 47 y.o. male.  HPI   Patient is a 47 year old male with a history of T2 DM, hypertension presents emergency department today complaining of right buttock pain that began last night before he went to bed.  Patient notes that he has recently started exercising again and did some squats several days ago.  He then developed pain to the right lower back and buttock yesterday.  He tried taking a muscle relaxer last night which helped him sleep however he still had pain this morning.  States that every time he moves his right lower extremity he begins to have muscle spasms of the right buttock which exacerbate his pain.  Pain radiates down the posterior aspect of his right leg.  Denies denies any numbness or weakness to the leg.  No loss control bowel or bladder function.  No history of cancer.  No history of IV drug use.  No fevers.  No recent falls or trauma.  He states that he had similar symptoms happen to him several years ago and he went to the emergency department and received a shot of unknown medication which completely resolved his symptoms.  He denies any urinary or abdominal symptoms.  States he no longer takes medication for diabetes or hypertension.   Past Medical History:  Diagnosis Date  . Diabetes mellitus without complication (HCC)   . Hypertension     There are no active problems to display for this patient.   No past surgical history on file.      Home Medications    Prior to Admission medications   Medication Sig Start Date End Date Taking? Authorizing Provider  acetaminophen (TYLENOL) 500 MG chewable tablet Chew 500 mg by mouth every 6 (six) hours as needed for pain.    [provider]  ibuprofen (ADVIL,MOTRIN) 800 MG  tablet Take 1 tablet (800 mg total) by mouth 3 (three) times daily. 05/26/15   Muthersbaugh, Dahlia Client, PA-C  metFORMIN (GLUCOPHAGE) 500 MG tablet Take 1 tablet (500 mg total) by mouth 2 (two) times daily with a meal. 06/06/16   Doug Sou, MD  methocarbamol (ROBAXIN) 500 MG tablet Take 1 tablet (500 mg total) by mouth 2 (two) times daily. 12/18/17   Davian Wollenberg S, PA-C  naproxen sodium (ANAPROX) 220 MG tablet Take 220 mg by mouth 2 (two) times daily with a meal.    [provider]  predniSONE (STERAPRED UNI-PAK 21 TAB) 10 MG (21) TBPK tablet Take by mouth daily. Take 6 tabs by mouth daily  for 2 days, then 5 tabs for 2 days, then 4 tabs for 2 days, then 3 tabs for 2 days, 2 tabs for 2 days, then 1 tab by mouth daily for 2 days 12/18/17   Javaun Dimperio S, PA-C    Family History No family history on file.  Social History Social History   Tobacco Use  . Smoking status: Former Games developer  . Smokeless tobacco: Never Used  Substance Use Topics  . Alcohol use: No  . Drug use: No     Allergies   Shellfish allergy   Review of Systems Review of Systems Constitutional: Negative for chills and fever.  HENT: Negative for ear pain and sore throat.   Eyes: Negative for  pain and visual disturbance.  Respiratory: Negative for shortness of breath.   Cardiovascular: Negative for chest pain and palpitations.  Gastrointestinal: Negative for abdominal pain, constipation, diarrhea, nausea and vomiting.  Genitourinary: Negative for dysuria and hematuria.  Musculoskeletal: Positive for back pain.  Skin: Negative for color change and rash.  Neurological: Negative for headaches.  All other systems reviewed and are negative.   Physical Exam Updated Vital Signs BP 108/69   Pulse 80   Temp 98.2 F (36.8 C) (Oral)   Resp 16   Ht 6' (1.829 m)   Wt 125.2 kg (276 lb)   SpO2 99%   BMI 37.43 kg/m   Physical Exam Constitutional: He appears well-developed and well-nourished.  HENT:    Head: Normocephalic and atraumatic.  Eyes: Conjunctivae are normal.  Neck: Neck supple.  Cardiovascular: Normal rate, regular rhythm, normal heart sounds and intact distal pulses.  No murmur heard. Pulmonary/Chest: Effort normal and breath sounds normal. No stridor. No respiratory distress. He has no wheezes.  Abdominal: Soft. Bowel sounds are normal. He exhibits no distension. There is no tenderness. There is no guarding.  Musculoskeletal: He exhibits no edema.  No TTP to the cervical, thoracic, or lumbar spine. No pain to the paraspinous muscles.  Tenderness over the sciatic notch on the right with muscle spasm to the right buttock which reproduces his pain.  Neurological: He is alert.  5/5 strength of bilateral lower extremities, normal sensation.  Distal pulses intact.  Patient will not stand as he states this makes his muscle spasm occur.  Skin: Skin is warm and dry. Capillary refill takes less than 2 seconds.  Psychiatric: He has a normal mood and affect.  Nursing note and vitals reviewed.  ED Treatments / Results  Labs (all labs ordered are listed, but only abnormal results are displayed) Labs Reviewed - No data to display  EKG None  Radiology No results found.  Procedures Procedures (including critical care time)  Medications Ordered in ED Medications  methocarbamol (ROBAXIN) injection 1,000 mg (has no administration in time range)  ketorolac (TORADOL) 15 MG/ML injection 15 mg (15 mg Intramuscular Given 12/18/17 1501)  fentaNYL (SUBLIMAZE) injection 50 mcg (50 mcg Intravenous Given 12/18/17 1609)     Initial Impression / Assessment and Plan / ED Course  I have reviewed the triage vital signs and the nursing notes.  Pertinent labs & imaging results that were available during my care of the patient were reviewed by me and considered in my medical decision making (see chart for details).   Final Clinical Impressions(s) / ED Diagnoses   Final diagnoses:  Sciatica of  right side   Normal neurological exam, no evidence of urinary incontinence or retention, pain is consistently reproducible. There is no evidence of AAA or concern for dissection at this time. Pt can bear weight however it is very painful. No loss of bowel or bladder control.  No concern for cauda equina.  No fever, night sweats, weight loss, h/o cancer, IVDU.  Pain treated here in the department with adequate improvement. RICE protocol, steroids, and pain medicine indicated and discussed with patient. I have also discussed reasons to return immediately to the ER.  Patient expresses understanding and agrees with plan.  ED Discharge Orders        Ordered    methocarbamol (ROBAXIN) 500 MG tablet  2 times daily     12/18/17 1548    predniSONE (STERAPRED UNI-PAK 21 TAB) 10 MG (21) TBPK tablet  Daily  12/18/17 1548       Carolanne Mercier S, PA-C 12/18/17 1704    Nyasha Rahilly S, PA-C 12/18/17 2307    Linwood DibblesKnapp, Jon, MD 12/18/17 2337

## 2017-12-18 NOTE — ED Notes (Addendum)
Patient verbalizes understanding of discharge instructions. Opportunity for questioning and answers were provided. Pt wanted to speak to PA about pain and request to stay.

## 2017-12-18 NOTE — ED Triage Notes (Signed)
Pt arrives to ED from home with complaints of back pain radiating to bottom and leg since yesterday, pain was worse today and patient was unable to walk due to pain. EMS reports pt was lifting weights two days ago. Patient was sore yesterday and took a muscle relaxer and that helped pt pain free enough to sleep. Today pt woke up this morning with extreme pain and unable to walk. Pt experienced this pain 10 months ago and received a shot of unknown kind and pt was better after that.. Pt placed in position of comfort with bed locked and lowered, call bell in reach.

## 2017-12-18 NOTE — ED Notes (Signed)
ED Provider at bedside. 

## 2017-12-18 NOTE — ED Provider Notes (Signed)
Patient was previously seen by PA before shift change.  Plan was for patient to be discharged after ambulation, however patient unable to walk.  See previous providers note for full H&P.  Briefly, patient has a history of right low back pain with sciatica who presents with worsening symptoms today.  His pain is worse with movement.  He has recently increased his exercising and lifting.  He denies any numbness or tingling, saddle anesthesia, bowel or bladder incontinence.  He also denies any fevers.  Patient was given Robaxin, Toradol, and fentanyl without relief.  Patient is tender in the right gluteal area to the right lumbar paraspinal region and down to posterior right thigh to upper calf.  He states the leg pain has started while he has been in the emergency department.  No midline cervical, thoracic, or lumbar tenderness.  5/5 strength to bilateral lower extremities, normal sensation throughout.  Will attempt further pain control with 0.5 mg Ativan and morphine 4 mg.  Patient no better after Ativan and morphine.  Patient given Dilaudid IM without improvement as well.  We will proceed with MRI to rule out emergent finding.  Patient unable to complete MRI due to difficulty lying flat and claustrophobia.  Patient is able to walk after 10 mg of Valium.  Will discharge home with Robaxin, prednisone, and Percocet.  Patient advised to see his doctor for further evaluation and treatment.  Return precautions given.  Patient understands and agrees with plan.  Patient vitals stable throughout ED course and discharged in satisfactory condition.     Emi HolesLaw, Jalaiyah Throgmorton M, PA-C 12/19/17 0119    Terrilee FilesButler, Michael C, MD 12/19/17 1134

## 2017-12-18 NOTE — Discharge Instructions (Addendum)
You may alternate taking Tylenol and Ibuprofen as needed for pain control. You may take 400-600 mg of ibuprofen every 6 hours and [phone] mg of Tylenol every 6 hours. Do not exceed 4000 mg of Tylenol daily as this can lead to liver damage. Also, make sure to take Ibuprofen with meals as it can cause an upset stomach. Do not take other NSAIDs while taking Ibuprofen such as (Aleve, Naprosyn, Aspirin, Celebrex, etc) and do not take more than the prescribed dose as this can lead to ulcers and bleeding in your GI tract. You may use warm and cold compresses to help with your symptoms.   You were given a prescription for steroids to help reduce inflammation.  Please take this medications as prescribed.  You were given a prescription for Robaxin which is a muscle relaxer.  You should not drive, work, or operate machinery while taking this medication as it can make you very drowsy.    Please follow up with your primary doctor within the next 7-10 days for re-evaluation and further treatment of your symptoms.   Please return to the ER sooner if you have any new or worsening symptoms.

## 2017-12-19 ENCOUNTER — Emergency Department (HOSPITAL_COMMUNITY): Payer: Medicaid Other

## 2017-12-19 MED ORDER — OXYCODONE-ACETAMINOPHEN 5-325 MG PO TABS: 1.0000 | ORAL_TABLET | Freq: Four times a day (QID) | ORAL | 0 refills | Status: AC | PRN

## 2017-12-19 MED ORDER — PREDNISONE 20 MG PO TABS
60.0000 mg | ORAL_TABLET | Freq: Once | ORAL | Status: AC
  Administered 2017-12-19: 60 mg via ORAL

## 2017-12-19 MED ORDER — PREDNISONE 20 MG PO TABS
ORAL_TABLET | ORAL | Status: AC
  Filled 2017-12-19: qty 3

## 2017-12-19 NOTE — ED Notes (Signed)
Patient verbalizes understanding of discharge instructions. Opportunity for questioning and answers were provided. Armband removed by staff, pt discharged from ED.  

## 2017-12-19 NOTE — ED Notes (Signed)
Pt back from MRI without success. Pt was in too much pain to lay flat. PA notified.

## 2017-12-19 NOTE — ED Notes (Signed)
Patient transported to MRI 

## 2020-10-20 ENCOUNTER — Other Ambulatory Visit: Payer: Self-pay | Admitting: Family Medicine

## 2020-10-20 ENCOUNTER — Ambulatory Visit: Payer: Self-pay

## 2020-10-20 ENCOUNTER — Other Ambulatory Visit: Payer: Self-pay

## 2020-10-20 DIAGNOSIS — Z0000 Encounter for general adult medical examination without abnormal findings: Secondary | ICD-10-CM

## 2022-04-01 ENCOUNTER — Emergency Department (HOSPITAL_BASED_OUTPATIENT_CLINIC_OR_DEPARTMENT_OTHER)
Admission: EM | Admit: 2022-04-01 | Discharge: 2022-04-01 | Disposition: A | Discharge status: Home / Self Care | Payer: Medicaid Other | Attending: Emergency Medicine | Admitting: Emergency Medicine

## 2022-04-01 ENCOUNTER — Other Ambulatory Visit: Payer: Self-pay

## 2022-04-01 ENCOUNTER — Encounter (HOSPITAL_BASED_OUTPATIENT_CLINIC_OR_DEPARTMENT_OTHER): Payer: Self-pay | Admitting: Emergency Medicine

## 2022-04-01 ENCOUNTER — Emergency Department (HOSPITAL_BASED_OUTPATIENT_CLINIC_OR_DEPARTMENT_OTHER): Payer: Medicaid Other | Admitting: Radiology

## 2022-04-01 DIAGNOSIS — E1165 Type 2 diabetes mellitus with hyperglycemia: Secondary | ICD-10-CM | POA: Insufficient documentation

## 2022-04-01 DIAGNOSIS — R42 Dizziness and giddiness: Secondary | ICD-10-CM | POA: Diagnosis present

## 2022-04-01 DIAGNOSIS — I10 Essential (primary) hypertension: Secondary | ICD-10-CM | POA: Insufficient documentation

## 2022-04-01 DIAGNOSIS — Z7984 Long term (current) use of oral hypoglycemic drugs: Secondary | ICD-10-CM | POA: Diagnosis not present

## 2022-04-01 DIAGNOSIS — R739 Hyperglycemia, unspecified: Secondary | ICD-10-CM

## 2022-04-01 LAB — BASIC METABOLIC PANEL
Anion gap: 10 (ref 5–15)
BUN: 12 mg/dL (ref 6–20)
CO2: 26 mmol/L (ref 22–32)
Calcium: 9.9 mg/dL (ref 8.9–10.3)
Chloride: 102 mmol/L (ref 98–111)
Creatinine, Ser: 1 mg/dL (ref 0.61–1.24)
GFR, Estimated: >60 mL/min (ref >60)
Glucose, Bld: 165 mg/dL — ABNORMAL HIGH (ref 70–99)
Potassium: 4.1 mmol/L (ref 3.5–5.1)
Sodium: 138 mmol/L (ref 135–145)

## 2022-04-01 LAB — CBG MONITORING, ED: Glucose-Capillary: 167 mg/dL — ABNORMAL HIGH (ref 70–99)

## 2022-04-01 LAB — URINALYSIS, ROUTINE W REFLEX MICROSCOPIC
Bilirubin Urine: NEGATIVE
Glucose, UA: NEGATIVE mg/dL
Hgb urine dipstick: NEGATIVE
Ketones, ur: NEGATIVE mg/dL
Leukocytes,Ua: NEGATIVE
Nitrite: NEGATIVE
Protein, ur: NEGATIVE mg/dL
Specific Gravity, Urine: 1.007 (ref 1.005–1.030)
pH: 5 (ref 5.0–8.0)

## 2022-04-01 LAB — CBC
HCT: 41.1 % (ref 39.0–52.0)
Hemoglobin: 14.7 g/dL (ref 13.0–17.0)
MCH: 29.6 pg (ref 26.0–34.0)
MCHC: 35.8 g/dL (ref 30.0–36.0)
MCV: 82.9 fL (ref 80.0–100.0)
Platelets: 270 10*3/uL (ref 150–400)
RBC: 4.96 MIL/uL (ref 4.22–5.81)
RDW: 11.8 % (ref 11.5–15.5)
WBC: 8.9 10*3/uL (ref 4.0–10.5)
nRBC: 0 % (ref 0.0–0.2)

## 2022-04-01 MED ORDER — METFORMIN HCL 500 MG PO TABS: 500.0000 mg | ORAL_TABLET | Freq: Two times a day (BID) | ORAL | 0 refills | Status: AC

## 2022-04-01 NOTE — ED Triage Notes (Signed)
Pt presents to ED POV. Pt c/o dizziness and HA. Pt reports s/s began upon waking. Pt reports non compliance w/ his home meds d/t insurance problems.

## 2022-04-01 NOTE — Discharge Instructions (Addendum)
Work up for dizziness was overall reassuring.  In terms of your limited access to your medications and healthcare due to recent unemployment, I have reached out to social work on your behalf so they I hope you on the furniture to gain access to medications particularly your metformin for your ongoing diabetes.  As for your concern for blood pressure, your blood pressure was actually only mildly elevated for this encounter which is why did not reveal your blood pressure medication.  Recommend that you follow-up with your PCP for ongoing evaluation for possible elevated blood pressure or hypertension.  If your dizziness returns, you pass out, new headache, new chest pain or shortness of breath please return to the emergency department for further evaluation.

## 2022-04-01 NOTE — ED Provider Notes (Signed)
[facility] ([facility]) EMERGENCY DEPT Provider Note   CSN: [id number] Arrival date & time: 04/01/22  1511     History  Chief Complaint  Patient presents with   Dizziness   HPI Ryan Gamble is a 51 y.o. male with hypertension and diabetes presenting for dizziness. Dizziness started this morning.  Patient was attempting to get out of his bed when he felt lightheaded and dizzy.  He attempted to walk to the bathroom but was unable to due to loss of balance.  Denies room spinning sensation.  Did endorse visual disturbance.  He sat upright in his bed for a minute and the dizziness improved.  He was able to go to the bathroom and then sit down on the couch and drink some water.  About an hour later his dizziness and visual disturbance had completely gone away. This afternoon he developed a headache.  Constellation of the symptoms prompted him to be seen in the emergency department.  He also mentioned that he lost his job for 5 months ago and has not been able to refill his medications for his blood pressure and diabetes due to lack of medical insurance. Patient mentioned his headache had also gone away a couple hours ago.  At this time he is asymptomatic.  Denies chest pain, palpitations, shortness of breath, abdominal pain, and fever.   Dizziness      Home Medications Prior to Admission medications   Medication Sig Start Date End Date Taking? Authorizing Provider  acetaminophen (TYLENOL) 500 MG chewable tablet Chew 500 mg by mouth every 6 (six) hours as needed for pain.    [provider]  ibuprofen (ADVIL,MOTRIN) 800 MG tablet Take 1 tablet (800 mg total) by mouth 3 (three) times daily. Patient not taking: Reported on 12/18/2017 05/26/15   Muthersbaugh, Dahlia Client, PA-C  metFORMIN (GLUCOPHAGE) 500 MG tablet Take 1 tablet (500 mg total) by mouth 2 (two) times daily with a meal. 04/01/22   Gareth Eagle, PA-C  methocarbamol (ROBAXIN) 500 MG tablet Take 1 tablet (500 mg total) by  mouth 2 (two) times daily. 12/18/17   Couture, Cortni S, PA-C  naproxen sodium (ANAPROX) 220 MG tablet Take 220 mg by mouth 2 (two) times daily with a meal.    [provider]  oxyCODONE-acetaminophen (PERCOCET/ROXICET) 5-325 MG tablet Take 1-2 tablets by mouth every 6 (six) hours as needed for severe pain. 12/19/17   Law, Waylan Boga, PA-C  predniSONE (STERAPRED UNI-PAK 21 TAB) 10 MG (21) TBPK tablet Take by mouth daily. Take 6 tabs by mouth daily  for 2 days, then 5 tabs for 2 days, then 4 tabs for 2 days, then 3 tabs for 2 days, 2 tabs for 2 days, then 1 tab by mouth daily for 2 days 12/18/17   Couture, Cortni S, PA-C      Allergies    Shellfish allergy    Review of Systems   Review of Systems  Neurological:  Positive for dizziness.    Physical Exam Updated Vital Signs BP 127/89   Pulse 65   Temp 98.6 F (37 C) (Oral)   Resp 11   SpO2 99%  Physical Exam Vitals and nursing note reviewed.  HENT:     Head: Normocephalic and atraumatic.     Mouth/Throat:     Mouth: Mucous membranes are moist.  Eyes:     General:        Right eye: No discharge.        Left eye: No discharge.  Conjunctiva/sclera: Conjunctivae normal.  Cardiovascular:     Rate and Rhythm: Normal rate and regular rhythm.     Pulses: Normal pulses.     Heart sounds: Normal heart sounds.  Pulmonary:     Effort: Pulmonary effort is normal.     Breath sounds: Normal breath sounds.  Abdominal:     General: Abdomen is flat.     Palpations: Abdomen is soft.  Skin:    General: Skin is warm and dry.  Neurological:     General: No focal deficit present.     Comments: GCS 15. Speech is goal oriented. No deficits appreciated to CN III-XII; symmetric eyebrow raise, no facial drooping, tongue midline. Patient has equal grip strength bilaterally with 5/5 strength against resistance in all major muscle groups bilaterally. Sensation to light touch intact. Patient moves extremities without ataxia. Normal  finger-nose-finger. Patient ambulatory with steady gait.   Psychiatric:        Mood and Affect: Mood normal.     ED Results / Procedures / Treatments   Labs (all labs ordered are listed, but only abnormal results are displayed) Labs Reviewed  BASIC METABOLIC PANEL - Abnormal; Notable for the following components:      Result Value   Glucose, Bld 165 (*)    All other components within normal limits  URINALYSIS, ROUTINE W REFLEX MICROSCOPIC - Abnormal; Notable for the following components:   Color, Urine COLORLESS (*)    All other components within normal limits  CBG MONITORING, ED - Abnormal; Notable for the following components:   Glucose-Capillary 167 (*)    All other components within normal limits  CBC  CBG MONITORING, ED  CBG MONITORING, ED    EKG EKG Interpretation  Date/Time:  Saturday April 01 2022 19:58:25 EDT Ventricular Rate:  63 PR Interval:  142 QRS Duration: 114 QT Interval:  409 QTC Calculation: 419 R Axis:   -36 Text Interpretation: Sinus rhythm Borderline IVCD with LAD Abnormal R-wave progression, early transition Minimal ST elevation, anterior leads No old tracing to compare Confirmed by Isla Pence [phone]) on 04/01/2022 9:02:06 PM  Radiology DG Chest 2 View  Result Date: 04/01/2022 CLINICAL DATA:  Headache and dizziness. EXAM: CHEST - 2 VIEW COMPARISON:  October 20, 2020 FINDINGS: The heart size and mediastinal contours are within normal limits. Both lungs are clear. The visualized skeletal structures are unremarkable. IMPRESSION: No active cardiopulmonary disease. Electronically Signed   By: Virgina Norfolk M.D.   On: 04/01/2022 20:53    Procedures Procedures    Medications Ordered in ED Medications - No data to display  ED Course/ Medical Decision Making/ A&P Clinical Course as of 04/01/22 2105  Sat Apr 01, 2022  2050 DG Chest 2 View [JR]    Clinical Course User Index [JR] Harriet Pho, PA-C                           Medical  Decision Making Amount and/or Complexity of Data Reviewed Labs: ordered.   This patient presents to the ED for concern of dizziness, this involves a number of treatment options, and is a complaint that carries with it a hgih risk of complications and morbidity.  The differential diagnosis includes CVA, electrolyte derangement, dehydration, peripheral vertigo, and arrhythmia.   Co morbidities: Discussed in HPI   EMR reviewed including pt PMHx, past surgical history and past visits to ER.   See HPI for more details   Lab Tests:  I ordered and independently interpreted labs. Labs notable for hyperglycemia   Imaging Studies:  NAD. I personally reviewed all imaging studies and no acute abnormality found. I agree with radiology interpretation.    Cardiac Monitoring:  The patient was maintained on a cardiac monitor.  I personally viewed and interpreted the cardiac monitored which showed an underlying rhythm of: NSR EKG non-ischemic   Medicines ordered:  No medications ordered Reevaluation of the patient after these medicines showed that the patient stayed the same.  Patient remained asymptomatic upon reevaluation. I have reviewed the patients home medicines and have made adjustments as needed   Consults/Attending Physician   I discussed this case with my attending physician who cosigned this note including patient's presenting symptoms, physical exam, and planned diagnostics and interventions. Attending physician stated agreement with plan or made changes to plan which were implemented.   Reevaluation:  After the interventions noted above I re-evaluated patient and found that they have :stayed the same   Social Determinants of Health:  The patient's social determinants of health were a factor in the care of this patient.     Problem List / ED Course: Presented for dizziness and headache.  Reassuring that dizziness was improved after repositioning particularly in  the upright seated position and hydration.  Did consider aortic dissection but unlikely given normal blood pressure and symmetric pulses and no chest pain or back pain.  Considered stroke but unlikely given no focal findings with normal gait.  Considered electrolyte derangement but unlikely given reassuring BMP.  Symptoms could be related to orthostatic hypotension or dehydration.  Patient was primarily concerned that he was unable to fill his blood pressure and diabetes medication.  I refilled his metformin and connected him with social work so that he might have better access to his required medications for his diabetes.  I did not review fill his blood pressure medicine due to his blood pressure being only slightly elevated during this encounter.  Recommend that he follow-up with his PCP.   Dispostion: After consideration of the diagnostic results and the patients response to treatment, I feel that the patient would benefit from discharge and ongoing management with metformin for his diabetes along with close follow-up with PCP. Also follow-up with social work regarding limited access to his medications and health care at this time due to his recent loss of employment.         Final Clinical Impression(s) / ED Diagnoses Final diagnoses:  Dizziness  Hyperglycemia    Rx / DC Orders ED Discharge Orders          Ordered    metFORMIN (GLUCOPHAGE) 500 MG tablet  2 times daily with meals        04/01/22 2007              Gareth Eagle, PA-C 04/01/22 2105    Jacalyn Lefevre, MD 04/01/22 2221

## 2022-04-17 ENCOUNTER — Ambulatory Visit: Payer: Medicaid Other | Admitting: Internal Medicine

## 2022-08-17 IMAGING — DX DG CHEST 1V
1 series · 1 of 1 positions shown · non-contrast
Comparison: 05/26/2015

CLINICAL DATA: Physical exam.  History of diabetes and hypertension

EXAM:
CHEST  1 VIEW

[chest pa]
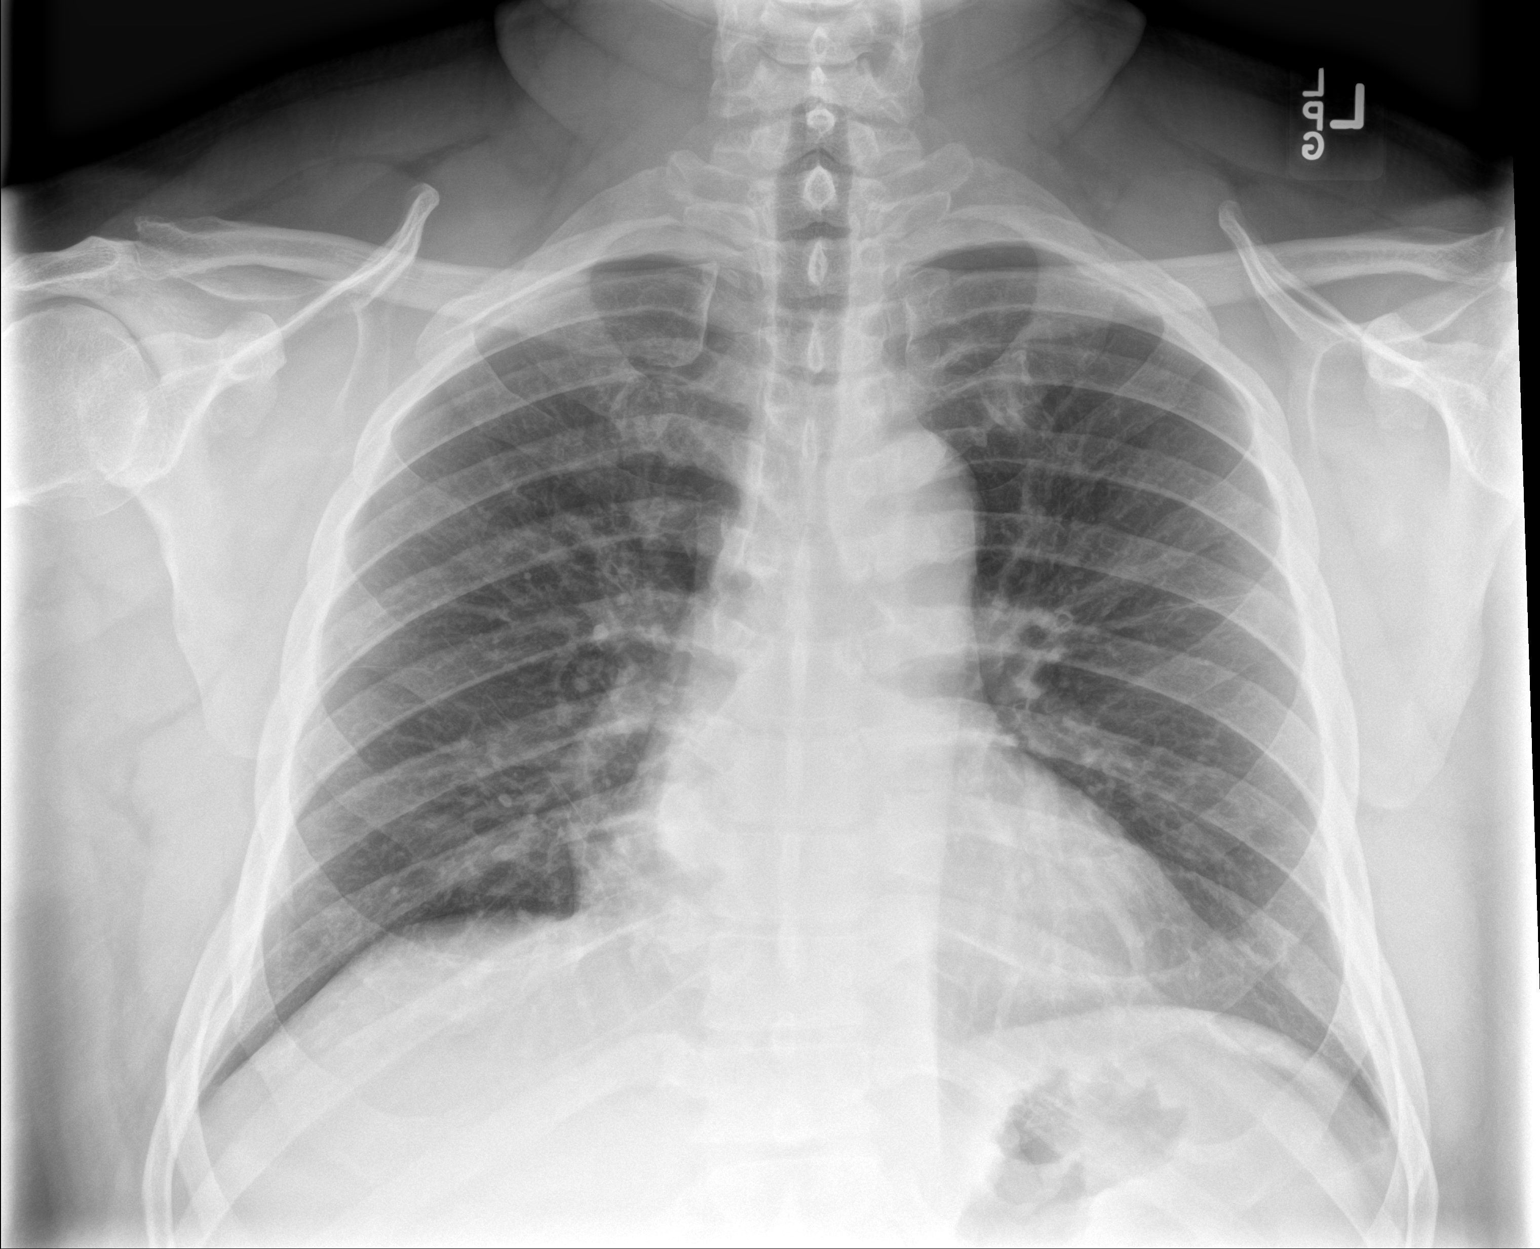

[1 of 1 positions shown; findings below may reference images not displayed]

FINDINGS: The heart size and mediastinal contours are within normal limits.
Both lungs are clear. The visualized skeletal structures are
unremarkable.
IMPRESSION: No active disease.
